# Patient Record
Sex: Male | Born: 1975 | Race: White | Hispanic: No | Marital: Married | Smoking: Never smoker
Health system: Southern US, Community
[De-identification: ages and names within clinical notes are randomized; demographics above are authoritative.]

## PROBLEM LIST (undated history)

## (undated) DIAGNOSIS — E785 Hyperlipidemia, unspecified: Secondary | ICD-10-CM

## (undated) DIAGNOSIS — T7840XA Allergy, unspecified, initial encounter: Secondary | ICD-10-CM

## (undated) HISTORY — DX: Allergy, unspecified, initial encounter: T78.40XA

## (undated) HISTORY — DX: Hyperlipidemia, unspecified: E78.5

## (undated) HISTORY — PX: ADENOIDECTOMY: SUR15

---

## 2019-01-21 ENCOUNTER — Other Ambulatory Visit: Payer: Self-pay

## 2019-01-21 ENCOUNTER — Encounter: Payer: Self-pay | Admitting: Sports Medicine

## 2019-01-21 ENCOUNTER — Ambulatory Visit (INDEPENDENT_AMBULATORY_CARE_PROVIDER_SITE_OTHER): Payer: 59 | Admitting: Sports Medicine

## 2019-01-21 DIAGNOSIS — R21 Rash and other nonspecific skin eruption: Secondary | ICD-10-CM

## 2019-01-21 MED ORDER — CLOTRIMAZOLE-BETAMETHASONE 1-0.05 % EX CREA: 1.0000 "application " | TOPICAL_CREAM | Freq: Two times a day (BID) | CUTANEOUS | 0 refills | Status: DC

## 2019-01-21 NOTE — Patient Instructions (Signed)
Appears to be simple lichenification, though unsure as to whether this is lichen simplex, lichen planus, idiopathic lichenification, or lichen sclerosis, as these would be more dermatopathologic diagnoses. Adding topical Lotrisone. We will do this for a solid month twice a day, if no improvement at the 1 month follow-up we will most certainly do a biopsy.  

## 2019-01-21 NOTE — Assessment & Plan Note (Addendum)
Appears to be simple lichenification, though unsure as to whether this is lichen simplex, lichen planus, idiopathic lichenification, or lichen sclerosis, as these would be more dermatopathologic diagnoses. Adding topical Lotrisone. We will do this for a solid month twice a day, if no improvement at the 1 month follow-up we will most certainly do a biopsy.

## 2019-01-21 NOTE — Progress Notes (Signed)
Subjective:    CC: New patient visit with the below complaints as noted in HPI:  HPI:  Barry Burns is a pleasant 43 year old male, recently moved to [place], he has noted for the past several years a rash on his left scrotum, just at the base of the shaft of the penis.  It is asymptomatic but he feels as though it has been growing recently.  Symptoms are moderate, persistent, localized without radiation.  He recently had routine labs done last fall.  I reviewed the past medical history, family history, social history, surgical history, and allergies today and no changes were needed.  Please see the problem list section below in epic for further details.  Past Medical History: Past Medical History:  Diagnosis Date  . Hyperlipidemia    Past Surgical History: Past Surgical History:  Procedure Laterality Date  . ADENOIDECTOMY     Social History: Social History   Socioeconomic History  . Marital status: Married    Spouse name: Not on file  . Number of children: Not on file  . Years of education: Not on file  . Highest education level: Not on file  Occupational History  . Not on file  Social Needs  . Financial resource strain: Not on file  . Food insecurity    Worry: Not on file    Inability: Not on file  . Transportation needs    Medical: Not on file    Non-medical: Not on file  Tobacco Use  . Smoking status: Never Smoker  . Smokeless tobacco: Never Used  Substance and Sexual Activity  . Alcohol use: Yes  . Drug use: Never  . Sexual activity: Yes  Lifestyle  . Physical activity    Days per week: Not on file    Minutes per session: Not on file  . Stress: Not on file  Relationships  . Social Herbalist on phone: Not on file    Gets together: Not on file    Attends religious service: Not on file    Active member of club or organization: Not on file    Attends meetings of clubs or organizations: Not on file    Relationship status: Not on file  Other  Topics Concern  . Not on file  Social History Narrative  . Not on file   Family History: Family History  Problem Relation Age of Onset  . Hypertension Mother   . Diabetes Father    Allergies: Allergies  Allergen Reactions  . Penicillins    Medications: See med rec.  Review of Systems: No headache, visual changes, nausea, vomiting, diarrhea, constipation, dizziness, abdominal pain, skin rash, fevers, chills, night sweats, swollen lymph nodes, weight loss, chest pain, body aches, joint swelling, muscle aches, shortness of breath, mood changes, visual or auditory hallucinations.  Objective:    General: Well Developed, well nourished, and in no acute distress.  Neuro: Alert and oriented x3, extra-ocular muscles intact, sensation grossly intact.  HEENT: Normocephalic, atraumatic, pupils equal round reactive to light, neck supple, no masses, no lymphadenopathy, thyroid nonpalpable.  Skin: Warm and dry, on the left scrotum at the base of the penis there is a 2 cm lichenified rash somewhat lighter than the surrounding skin with a slightly raised border.  No signs of bacterial infection, no actual scaliness.  No hyperpigmentation. Cardiac: Regular rate and rhythm, no murmurs rubs or gallops.  Respiratory: Clear to auscultation bilaterally. Not using accessory muscles, speaking in full sentences.  Abdominal: Soft, nontender,  nondistended, positive bowel sounds, no masses, no organomegaly.  Musculoskeletal: Shoulder, elbow, wrist, hip, knee, ankle stable, and with full range of motion.  Impression and Recommendations:    The patient was counselled, risk factors were discussed, anticipatory guidance given.  Rash on scrotum Appears to be simple lichenification, though unsure as to whether this is lichen simplex, lichen planus, idiopathic lichenification, or lichen sclerosis, as these would be more dermatopathologic diagnoses. Adding topical Lotrisone. We will do this for a solid month twice  a day, if no improvement at the 1 month follow-up we will most certainly do a biopsy.    ___________________________________________ Ihor Austinhomas J. Benjamin Stainhekkekandam, M.D., ABFM., CAQSM. Primary Care and Sports Medicine [place] [facility]  Adjunct Professor of Family Medicine  [employer]

## 2019-02-18 ENCOUNTER — Ambulatory Visit (INDEPENDENT_AMBULATORY_CARE_PROVIDER_SITE_OTHER): Payer: 59 | Admitting: Sports Medicine

## 2019-02-18 ENCOUNTER — Other Ambulatory Visit: Payer: Self-pay

## 2019-02-18 ENCOUNTER — Encounter: Payer: Self-pay | Admitting: Sports Medicine

## 2019-02-18 DIAGNOSIS — R21 Rash and other nonspecific skin eruption: Secondary | ICD-10-CM

## 2019-02-18 NOTE — Progress Notes (Signed)
  Subjective:    CC: Follow-up  HPI: Scrotal rash: Resolved.  I reviewed the past medical history, family history, social history, surgical history, and allergies today and no changes were needed.  Please see the problem list section below in epic for further details.  Past Medical History: Past Medical History:  Diagnosis Date  . Hyperlipidemia    Past Surgical History: Past Surgical History:  Procedure Laterality Date  . ADENOIDECTOMY     Social History: Social History   Socioeconomic History  . Marital status: Married    Spouse name: Not on file  . Number of children: Not on file  . Years of education: Not on file  . Highest education level: Not on file  Occupational History  . Not on file  Social Needs  . Financial resource strain: Not on file  . Food insecurity    Worry: Not on file    Inability: Not on file  . Transportation needs    Medical: Not on file    Non-medical: Not on file  Tobacco Use  . Smoking status: Never Smoker  . Smokeless tobacco: Never Used  Substance and Sexual Activity  . Alcohol use: Yes  . Drug use: Never  . Sexual activity: Yes  Lifestyle  . Physical activity    Days per week: Not on file    Minutes per session: Not on file  . Stress: Not on file  Relationships  . Social Herbalist on phone: Not on file    Gets together: Not on file    Attends religious service: Not on file    Active member of club or organization: Not on file    Attends meetings of clubs or organizations: Not on file    Relationship status: Not on file  Other Topics Concern  . Not on file  Social History Narrative  . Not on file   Family History: Family History  Problem Relation Age of Onset  . Hypertension Mother   . Diabetes Father    Allergies: Allergies  Allergen Reactions  . Penicillins    Medications: See med rec.  Review of Systems: No fevers, chills, night sweats, weight loss, chest pain, or shortness of breath.   Objective:     General: Well Developed, well nourished, and in no acute distress.  Neuro: Alert and oriented x3, extra-ocular muscles intact, sensation grossly intact.  HEENT: Normocephalic, atraumatic, pupils equal round reactive to light, neck supple, no masses, no lymphadenopathy, thyroid nonpalpable.  Skin: Warm and dry, no rashes. Cardiac: Regular rate and rhythm, no murmurs rubs or gallops, no lower extremity edema.  Respiratory: Clear to auscultation bilaterally. Not using accessory muscles, speaking in full sentences. Scrotum: Looks normal.  Impression and Recommendations:    Rash on scrotum Initial lichenified scrotal rash has completely resolved. May stop the Lotrisone and return as needed for this.   ___________________________________________ Gwen Her. Dianah Field, M.D., ABFM., CAQSM. Primary Care and Sports Medicine [facility] [facility]  Adjunct Professor of [place] [employer]

## 2019-02-18 NOTE — Assessment & Plan Note (Signed)
Initial lichenified scrotal rash has completely resolved. May stop the Lotrisone and return as needed for this.

## 2021-06-08 ENCOUNTER — Ambulatory Visit (INDEPENDENT_AMBULATORY_CARE_PROVIDER_SITE_OTHER): Payer: 59 | Admitting: Sports Medicine

## 2021-06-08 ENCOUNTER — Other Ambulatory Visit: Payer: Self-pay

## 2021-06-08 DIAGNOSIS — Z0000 Encounter for general adult medical examination without abnormal findings: Secondary | ICD-10-CM | POA: Diagnosis not present

## 2021-06-08 DIAGNOSIS — L608 Other nail disorders: Secondary | ICD-10-CM | POA: Diagnosis not present

## 2021-06-08 DIAGNOSIS — M25473 Effusion, unspecified ankle: Secondary | ICD-10-CM | POA: Insufficient documentation

## 2021-06-08 DIAGNOSIS — E785 Hyperlipidemia, unspecified: Secondary | ICD-10-CM | POA: Diagnosis not present

## 2021-06-08 NOTE — Assessment & Plan Note (Signed)
This pleasant 45 year old male has had a few episodes in the past of severe ankle swelling and pain, hitting a crescendo over a couple of days and then resolving. Prednisone seem to help, he was seen in orthopedic urgent care, x-rays unrevealing. Typically this has occurred after exercising, based on history this sounds to be either a stress injury or pseudogout, if this happens again we will do an arthrocentesis for crystal analysis.   He understands to come in ASAP if and when this happens again. Checking uric acid levels in the meantime.

## 2021-06-08 NOTE — Progress Notes (Addendum)
    Procedures performed today:    None.  Independent interpretation of notes and tests performed by another provider:   None.  Brief History, Exam, Impression, and Recommendations:    Ankle swelling and pain with hyperuricemia This pleasant 45 year old male has had a few episodes in the past of severe ankle swelling and pain, hitting a crescendo over a couple of days and then resolving. Prednisone seem to help, he was seen in orthopedic urgent care, x-rays unrevealing. Typically this has occurred after exercising, based on history this sounds to be either a stress injury or pseudogout, if this happens again we will do an arthrocentesis for crystal analysis.   He understands to come in ASAP if and when this happens again. Checking uric acid levels in the meantime.   Longitudinal melanonychia He has also noted a brown streak longitudinally over his thumbnail, it is light brown, there is no subungual discoloration, there is no depression in the nail itself, i.e. a negative Hutchinson sign. We will keep an eye on this.   Hyperlipidemia Lipids highly elevated.  Probably needs a statin.    ___________________________________________ Ihor Austin. Benjamin Stain, M.D., ABFM., CAQSM. Primary Care and Sports Medicine [place] [facility]  Adjunct Instructor of Family Medicine  [employer]

## 2021-06-08 NOTE — Assessment & Plan Note (Signed)
He has also noted a brown streak longitudinally over his thumbnail, it is light brown, there is no subungual discoloration, there is no depression in the nail itself, i.e. a negative Hutchinson sign. We will keep an eye on this.

## 2021-06-13 LAB — COMPREHENSIVE METABOLIC PANEL
AG Ratio: 1.9 (calc) (ref 1.0–2.5)
ALT: 55 U/L — ABNORMAL HIGH (ref 9–46)
AST: 35 U/L (ref 10–40)
Albumin: 4.6 g/dL (ref 3.6–5.1)
Alkaline phosphatase (APISO): 52 U/L (ref 36–130)
BUN: 15 mg/dL (ref 7–25)
CO2: 28 mmol/L (ref 20–32)
Calcium: 9.8 mg/dL (ref 8.6–10.3)
Chloride: 102 mmol/L (ref 98–110)
Creat: 1.07 mg/dL (ref 0.60–1.29)
Globulin: 2.4 g/dL (calc) (ref 1.9–3.7)
Glucose, Bld: 96 mg/dL (ref 65–99)
Potassium: 4.6 mmol/L (ref 3.5–5.3)
Sodium: 138 mmol/L (ref 135–146)
Total Bilirubin: 0.7 mg/dL (ref 0.2–1.2)
Total Protein: 7 g/dL (ref 6.1–8.1)

## 2021-06-13 LAB — URIC ACID: Uric Acid, Serum: 9.1 mg/dL — ABNORMAL HIGH (ref 4.0–8.0)

## 2021-06-13 LAB — CBC
HCT: 46.3 % (ref 38.5–50.0)
Hemoglobin: 15.8 g/dL (ref 13.2–17.1)
MCH: 30.7 pg (ref 27.0–33.0)
MCHC: 34.1 g/dL (ref 32.0–36.0)
MCV: 89.9 fL (ref 80.0–100.0)
MPV: 9.5 fL (ref 7.5–12.5)
Platelets: 194 10*3/uL (ref 140–400)
RBC: 5.15 10*6/uL (ref 4.20–5.80)
RDW: 12.8 % (ref 11.0–15.0)
WBC: 4.6 10*3/uL (ref 3.8–10.8)

## 2021-06-13 LAB — TSH: TSH: 3.31 mIU/L (ref 0.40–4.50)

## 2021-06-13 LAB — LIPID PANEL
Cholesterol: 312 mg/dL — ABNORMAL HIGH (ref <200)
HDL: 53 mg/dL (ref >=40)
LDL Cholesterol (Calc): 199 mg/dL (calc) — ABNORMAL HIGH
Non-HDL Cholesterol (Calc): 259 mg/dL (calc) — ABNORMAL HIGH (ref <130)
Total CHOL/HDL Ratio: 5.9 (calc) — ABNORMAL HIGH (ref <5.0)
Triglycerides: 352 mg/dL — ABNORMAL HIGH (ref <150)

## 2021-06-13 NOTE — Assessment & Plan Note (Signed)
Lipids highly elevated.  Probably needs a statin.

## 2021-06-18 ENCOUNTER — Encounter: Payer: Self-pay | Admitting: Sports Medicine

## 2021-06-18 ENCOUNTER — Other Ambulatory Visit: Payer: Self-pay

## 2021-06-18 ENCOUNTER — Ambulatory Visit (INDEPENDENT_AMBULATORY_CARE_PROVIDER_SITE_OTHER): Payer: 59 | Admitting: Sports Medicine

## 2021-06-18 VITALS — BP 147/96 | HR 75 | Wt 220.0 lb

## 2021-06-18 DIAGNOSIS — M25473 Effusion, unspecified ankle: Secondary | ICD-10-CM | POA: Diagnosis not present

## 2021-06-18 DIAGNOSIS — Z23 Encounter for immunization: Secondary | ICD-10-CM | POA: Diagnosis not present

## 2021-06-18 DIAGNOSIS — E785 Hyperlipidemia, unspecified: Secondary | ICD-10-CM | POA: Diagnosis not present

## 2021-06-18 DIAGNOSIS — Z0000 Encounter for general adult medical examination without abnormal findings: Secondary | ICD-10-CM | POA: Diagnosis not present

## 2021-06-18 MED ORDER — ALLOPURINOL 300 MG PO TABS: 300.0000 mg | ORAL_TABLET | Freq: Every day | ORAL | 3 refills | Status: DC

## 2021-06-18 MED ORDER — ROSUVASTATIN CALCIUM 20 MG PO TABS: 20.0000 mg | ORAL_TABLET | Freq: Every day | ORAL | 3 refills | Status: DC

## 2021-06-18 NOTE — Assessment & Plan Note (Signed)
Annual physical as above, Tdap today. Return to see me in a year.

## 2021-06-18 NOTE — Assessment & Plan Note (Signed)
Moderate hyperlipidemia, adding rosuvastatin 20, recheck in 2 months.

## 2021-06-18 NOTE — Assessment & Plan Note (Signed)
See prior notes, adding allopurinol, recheck in 2 months.

## 2021-06-18 NOTE — Addendum Note (Signed)
Addended by: Gaynelle Arabian on: 06/18/2021 10:36 AM   Modules accepted: Orders

## 2021-06-18 NOTE — Progress Notes (Signed)
  Subjective:    CC: Annual Physical Exam  HPI:  This patient is here for their annual physical  I reviewed the past medical history, family history, social history, surgical history, and allergies today and no changes were needed.  Please see the problem list section below in epic for further details.  Past Medical History: Past Medical History:  Diagnosis Date   Hyperlipidemia    Past Surgical History: Past Surgical History:  Procedure Laterality Date   ADENOIDECTOMY     Social History: Social History   Socioeconomic History   Marital status: Married    Spouse name: Not on file   Number of children: Not on file   Years of education: Not on file   Highest education level: Not on file  Occupational History   Not on file  Tobacco Use   Smoking status: Never   Smokeless tobacco: Never  Substance and Sexual Activity   Alcohol use: Yes   Drug use: Never   Sexual activity: Yes  Other Topics Concern   Not on file  Social History Narrative   Not on file   Social Determinants of Health   Financial Resource Strain: Not on file  Food Insecurity: Not on file  Transportation Needs: Not on file  Physical Activity: Not on file  Stress: Not on file  Social Connections: Not on file   Family History: Family History  Problem Relation Age of Onset   Hypertension Mother    Diabetes Father    Allergies: Allergies  Allergen Reactions   Penicillins    Medications: See med rec.  Review of Systems: No headache, visual changes, nausea, vomiting, diarrhea, constipation, dizziness, abdominal pain, skin rash, fevers, chills, night sweats, swollen lymph nodes, weight loss, chest pain, body aches, joint swelling, muscle aches, shortness of breath, mood changes, visual or auditory hallucinations.  Objective:    General: Well Developed, well nourished, and in no acute distress.  Neuro: Alert and oriented x3, extra-ocular muscles intact, sensation grossly intact. Cranial nerves II  through XII are intact, motor, sensory, and coordinative functions are all intact. HEENT: Normocephalic, atraumatic, pupils equal round reactive to light, neck supple, no masses, no lymphadenopathy, thyroid nonpalpable. Oropharynx, nasopharynx, external ear canals are unremarkable. Skin: Warm and dry, no rashes noted.  Cardiac: Regular rate and rhythm, no murmurs rubs or gallops.  Respiratory: Clear to auscultation bilaterally. Not using accessory muscles, speaking in full sentences.  Abdominal: Soft, nontender, nondistended, positive bowel sounds, no masses, no organomegaly.  Musculoskeletal: Shoulder, elbow, wrist, hip, knee, ankle stable, and with full range of motion.  Impression and Recommendations:    The patient was counselled, risk factors were discussed, anticipatory guidance given.  Annual physical exam Annual physical as above, Tdap today. Return to see me in a year.  Ankle swelling and pain with hyperuricemia See prior notes, adding allopurinol, recheck in 2 months.  Hyperlipidemia Moderate hyperlipidemia, adding rosuvastatin 20, recheck in 2 months.   ___________________________________________ Ihor Austin. Benjamin Stain, M.D., ABFM., CAQSM. Primary Care and Sports Medicine [place] [facility]  Adjunct Professor of Family Medicine  [employer]

## 2021-07-31 ENCOUNTER — Encounter: Payer: Self-pay | Admitting: Sports Medicine

## 2021-07-31 DIAGNOSIS — M25473 Effusion, unspecified ankle: Secondary | ICD-10-CM

## 2021-08-01 ENCOUNTER — Ambulatory Visit (INDEPENDENT_AMBULATORY_CARE_PROVIDER_SITE_OTHER): Payer: 59

## 2021-08-01 ENCOUNTER — Other Ambulatory Visit: Payer: Self-pay

## 2021-08-01 ENCOUNTER — Ambulatory Visit (INDEPENDENT_AMBULATORY_CARE_PROVIDER_SITE_OTHER): Payer: 59 | Admitting: Sports Medicine

## 2021-08-01 DIAGNOSIS — M25473 Effusion, unspecified ankle: Secondary | ICD-10-CM

## 2021-08-01 MED ORDER — PREDNISONE 50 MG PO TABS: 50.0000 mg | ORAL_TABLET | Freq: Every day | ORAL | 0 refills | Status: AC

## 2021-08-01 NOTE — Assessment & Plan Note (Signed)
This pleasant 46 year old male returns, he is having what appears to be a gout flare. Increasing pain, swelling, left first MTP and left first IP joint. The entire foot is however swollen. We did ultrasound the foot looking for drainable fluid collection, he has significant synovitis at the IP joint and the MTP but nothing that would be amenable to drainage for arthrocentesis and crystal analysis. Adding a 5-day burst of prednisone, I would like him to return to get his uric acid levels rechecked a month after the flare. Goal uric acid levels are less than 5.

## 2021-08-01 NOTE — Progress Notes (Signed)
° ° °  Procedures performed today:    Procedure: Diagnostic Ultrasound of left foot Device: Samsung [id number]  Findings: Noted synovitis left first MTP and left first IP joint Images permanently stored in PACS. 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  Independent interpretation of notes and tests performed by another provider:   None.  Brief History, Exam, Impression, and Recommendations:    Ankle swelling and pain with hyperuricemia This pleasant 46 year old male returns, he is having what appears to be a gout flare. Increasing pain, swelling, left first MTP and left first IP joint. The entire foot is however swollen. We did ultrasound the foot looking for drainable fluid collection, he has significant synovitis at the IP joint and the MTP but nothing that would be amenable to drainage for arthrocentesis and crystal analysis. Adding a 5-day burst of prednisone, I would like him to return to get his uric acid levels rechecked a month after the flare. Goal uric acid levels are less than 5.  Chronic process with exacerbation and pharmacologic intervention  ___________________________________________ Barry Burns. Benjamin Stain, M.D., ABFM., CAQSM. Primary Care and Sports Medicine [place] [facility]  Adjunct Instructor of Family Medicine  [employer]

## 2021-08-07 NOTE — Telephone Encounter (Signed)
MRI of the foot and ankle ordered, he has had x-rays at an outside facility within the past 6 weeks, has failed greater than 6 weeks of conservative treatment in spite of injections, medications.

## 2021-08-12 ENCOUNTER — Ambulatory Visit (INDEPENDENT_AMBULATORY_CARE_PROVIDER_SITE_OTHER): Payer: 59

## 2021-08-12 ENCOUNTER — Other Ambulatory Visit: Payer: Self-pay

## 2021-08-12 DIAGNOSIS — M25572 Pain in left ankle and joints of left foot: Secondary | ICD-10-CM

## 2021-08-12 DIAGNOSIS — M25472 Effusion, left ankle: Secondary | ICD-10-CM | POA: Diagnosis not present

## 2021-08-12 DIAGNOSIS — M25473 Effusion, unspecified ankle: Secondary | ICD-10-CM

## 2021-08-12 DIAGNOSIS — M79672 Pain in left foot: Secondary | ICD-10-CM | POA: Diagnosis not present

## 2021-08-12 IMAGING — MR MR ANKLE*L* W/O CM
4 of 5 series · 30 of 40 positions shown · non-contrast
Comparison: None.

CLINICAL DATA: Ankle pain. Osteochondral lesion suspected.
Persistent ankle pain and swelling for 2 weeks.

EXAM:
MRI OF THE LEFT ANKLE WITHOUT CONTRAST
TECHNIQUE: Multiplanar, multisequence MR imaging of the ankle was performed. No
intravenous contrast was administered.

[Series 1: PD fat-sat · axial · 3.0mm · 0.62mm/px · z∈[-99,+48]mm · 8 of 37 slices shown]
[im 1/37]
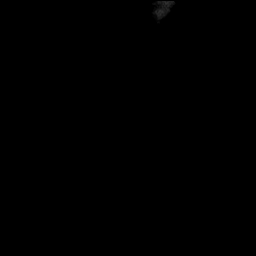
[im 6/37]
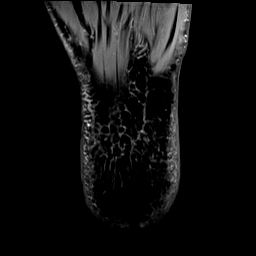
[im 11/37]
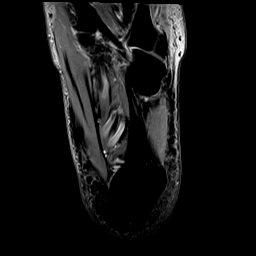
[im 16/37]
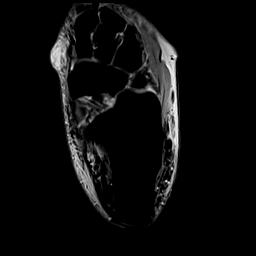
[im 21/37]
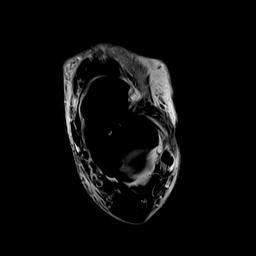
[im 26/37]
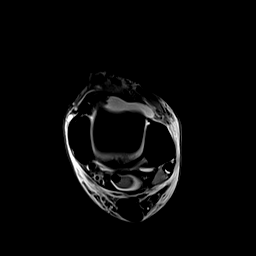
[im 31/37]
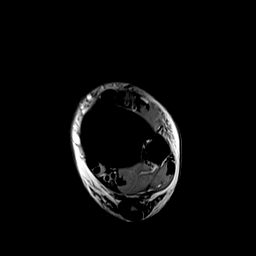
[im 37/37]
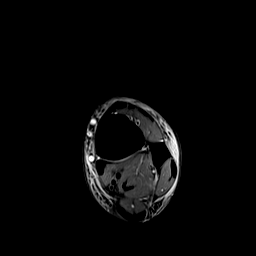

[Series 2: T2 fat-sat · axial · 3.0mm · 0.62mm/px · z∈[-99,+48]mm · 9 of 37 slices shown (1 of 2)]
[im 1/37]
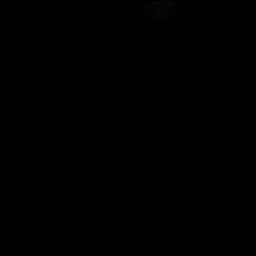
[im 5/37]
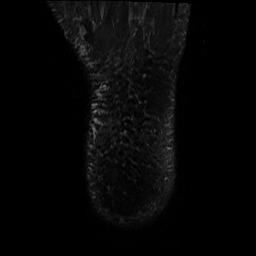
[im 10/37]
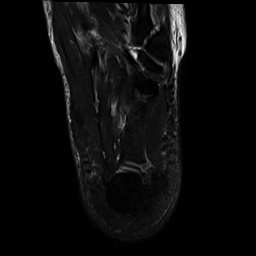
[im 14/37]
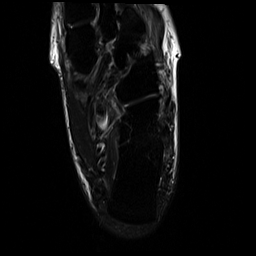
[im 19/37]
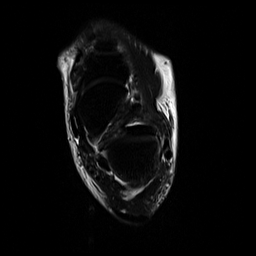
[im 23/37]
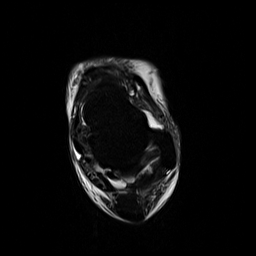
[im 28/37]
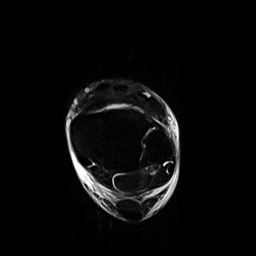
[im 32/37]
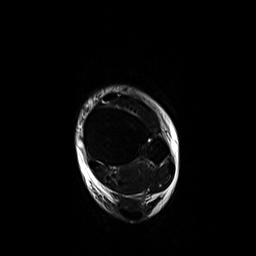
[im 37/37]
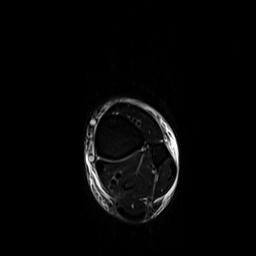

[Series 3: T2 fat-sat · coronal · 3.0mm · 0.31mm/px · 9 of 38 slices shown (2 of 2)]
[im 1/38]
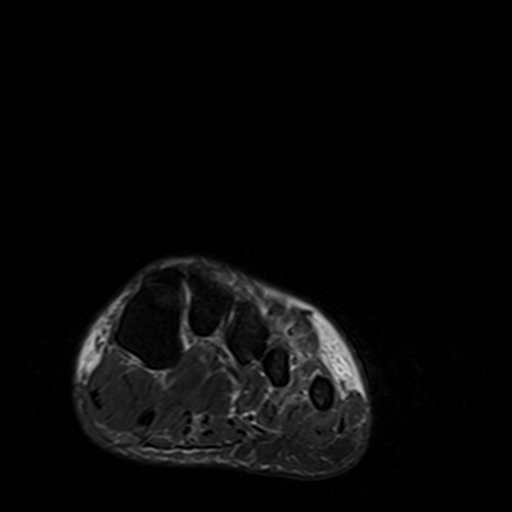
[im 5/38]
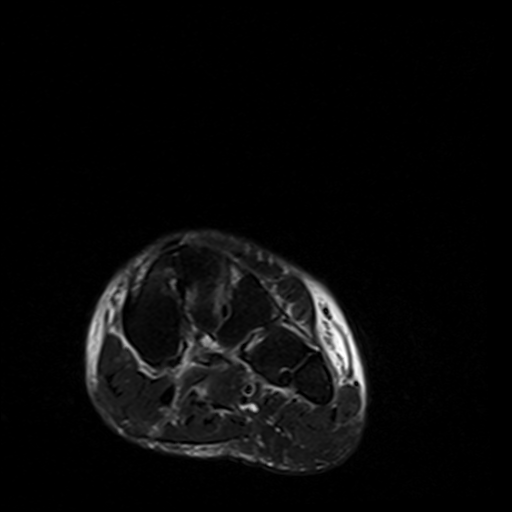
[im 10/38]
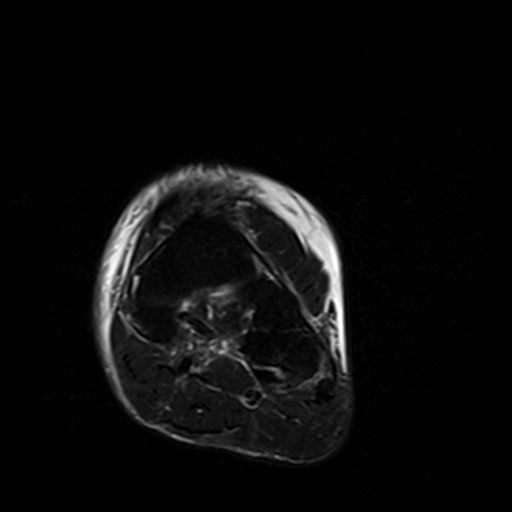
[im 14/38]
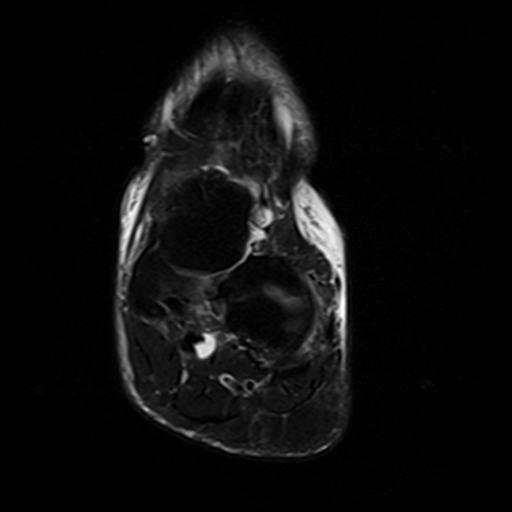
[im 19/38]
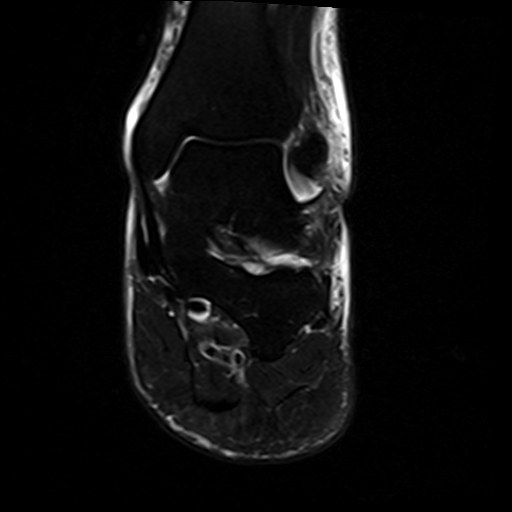
[im 24/38]
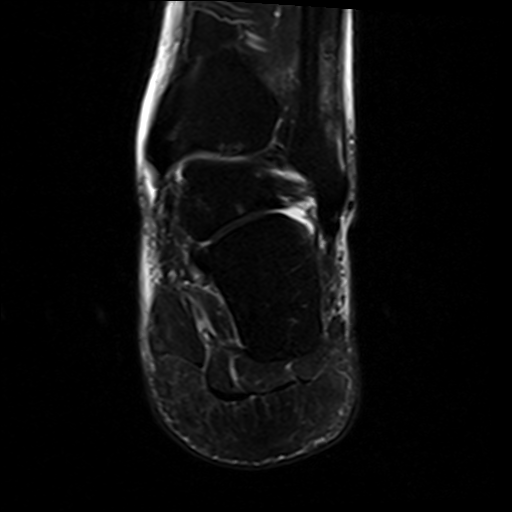
[im 28/38]
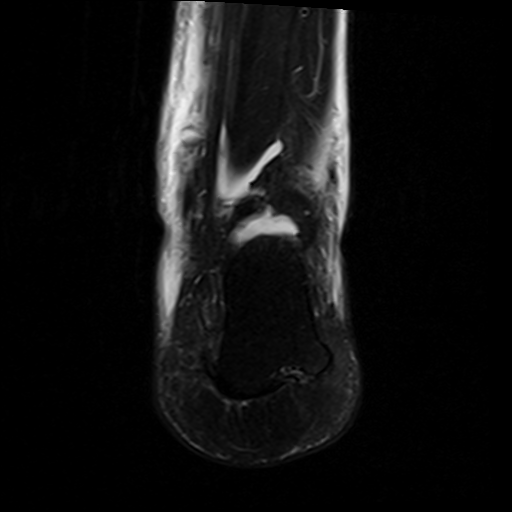
[im 33/38]
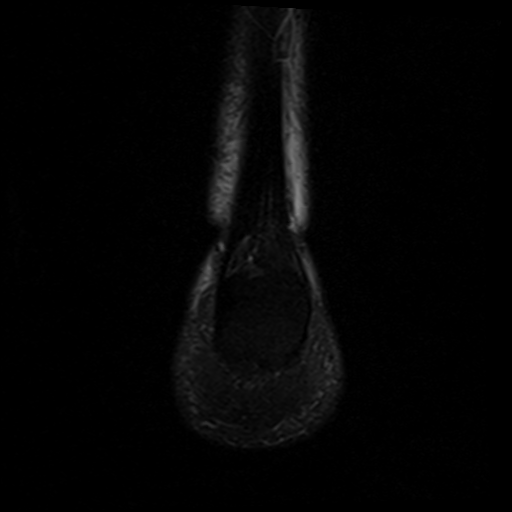
[im 38/38]
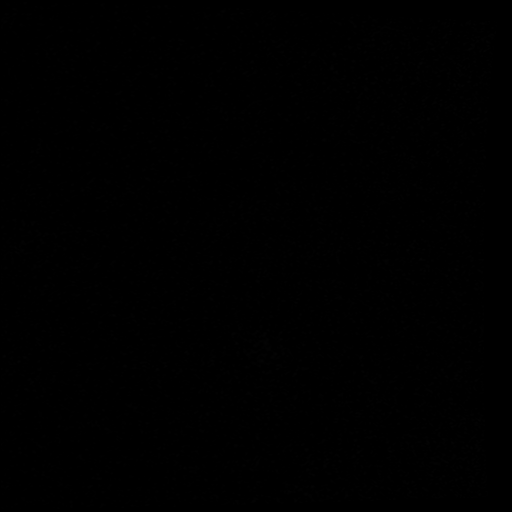

[Series 4: T1 · sagittal · 3.0mm · 0.31mm/px · 4 of 28 slices shown]
[im 1/28]
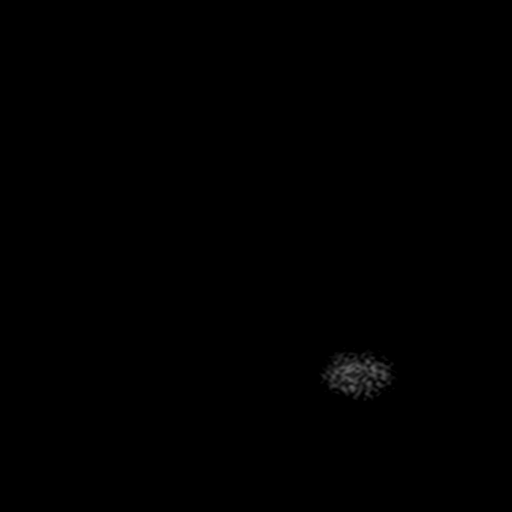
[im 5/28]
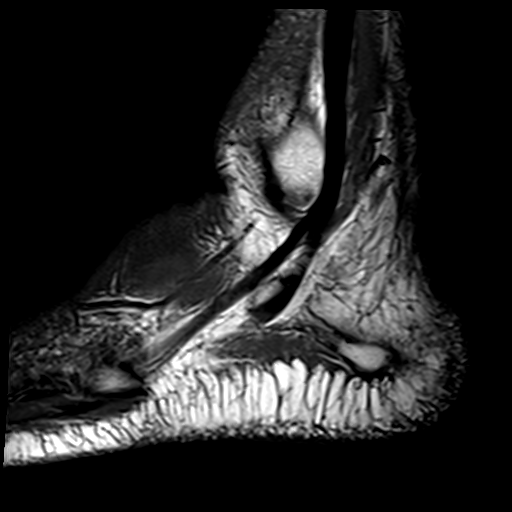
[im 14/28]
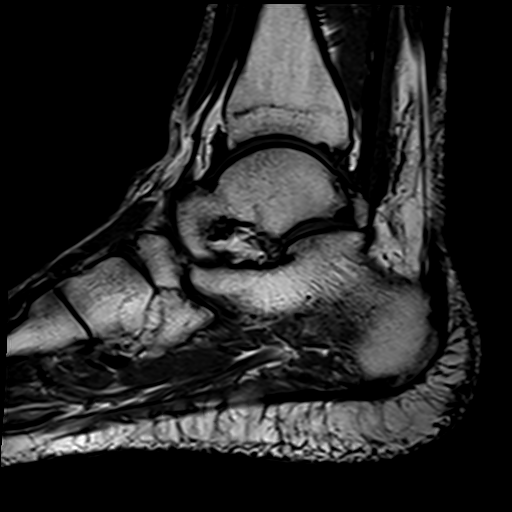
[im 23/28]
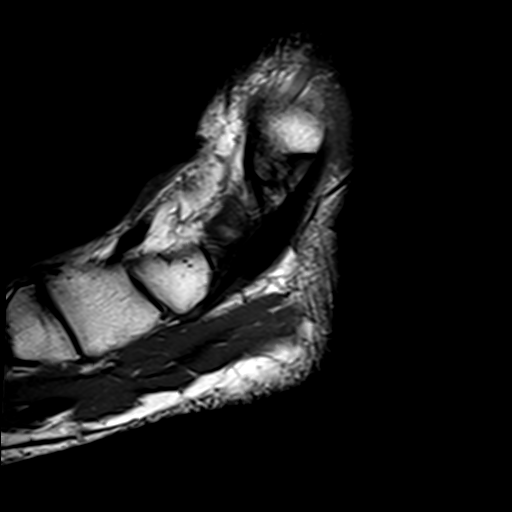

[30 of 40 positions shown; findings below may reference images not displayed]

FINDINGS: TENDONS

Peroneal: There is a mild "JAYLON configuration" of the peroneus
brevis tendon just distal to the fibula (axial images 19 through 21,
coronal images 17 and 18). The peroneus longus tendon is intact.

Posteromedial: Mild posterior tibial tenosynovitis. There are
mild-to-moderate tibiotalar and subtalar joint effusions. Some of
this joint fluid is likely the etiology for moderate fluid within
the flexor hallucis longus tendon sheath within the posterior ankle
and the plantar hindfoot proximal to the intersection with the
flexor digitorum longus tendon at the knot of JAYLON. Also is likely
mild superimposed flexor hallucis longus tenosynovitis. The flexor
digitorum longus tendon is intact.

Anterior: The tibialis anterior and extensor hallucis longus tendons
are intact. Mild extensor digitorum longus tenosynovitis at the
level of the distal tibia.

Achilles: Intact.

Plantar Fascia: Intact.

LIGAMENTS

Lateral: The anterior and posterior talofibular, anterior and
posterior tibiofibular, and calcaneofibular ligaments are intact.

Medial: The tibiotalar deep deltoid and tibial spring ligaments are
intact.

CARTILAGE

Ankle Joint: There is mild thinning of the posterior tibial plafond
cartilage with focal subchondral increased T2 signal in a region
measuring up to 4 mm in AP dimension (sagittal image 14) and 7 mm in
transverse dimension (coronal image 15).

Subtalar Joints/Sinus Tarsi: Fat is preserved within sinus tarsi.

Bones: Mild cartilage thinning and subchondral marrow edema within
the second tarsometatarsal joint.

Other: The tarsal tunnel is unremarkable. The Lisfranc ligament
complex is intact.
IMPRESSION: :
IMPRESSION: 1. Mild partial-thickness longitudinal split tear of the peroneus
brevis tendon distal to the fibula.
2. Mild posterior tibial and flexor hallucis longus tenosynovitis.
Some of the fluid within the flexor hallucis longus tendon sheath is
likely secondary to tibiotalar and subtalar joint effusions.
3. Mild extensor digitorum longus tenosynovitis.
4. Mild thinning of the posterior tibial plafond cartilage.

## 2021-08-12 IMAGING — MR MR FOOT*L* W/O CM
5 series · 40 of 40 positions shown · non-contrast
Comparison: None available

CLINICAL DATA: Foot pain. Stress fracture suspected. Persistent
foot pain and swelling for 2 weeks.

EXAM:
MRI OF THE LEFT FOOT WITHOUT CONTRAST
TECHNIQUE: Multiplanar, multisequence MR imaging of the left forefoot was
performed. No intravenous contrast was administered.

[Series 4: T1 · coronal · 3.0mm · 0.38mm/px · 11 of 46 slices shown (1 of 2)]
[im 1/46]
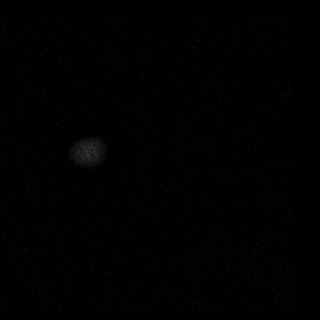
[im 5/46]
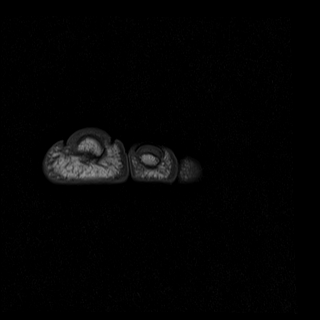
[im 10/46]
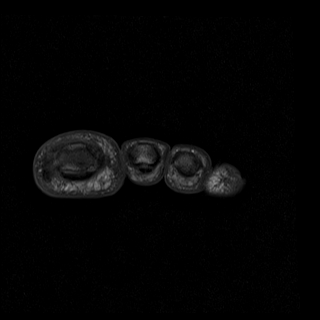
[im 14/46]
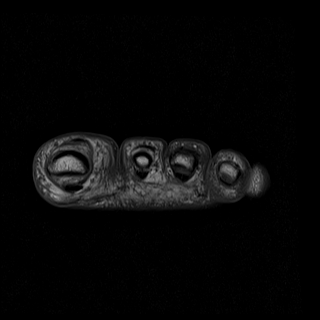
[im 19/46]
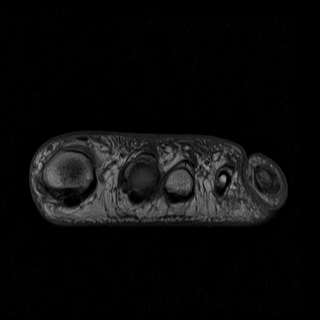
[im 23/46]
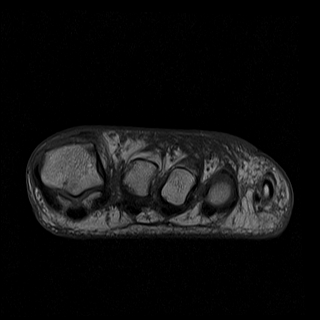
[im 28/46]
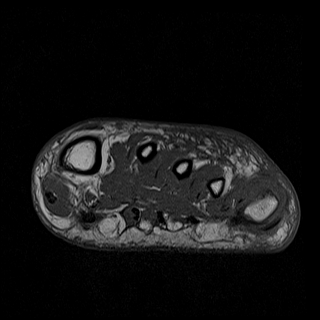
[im 32/46]
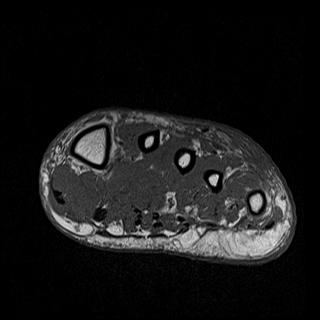
[im 37/46]
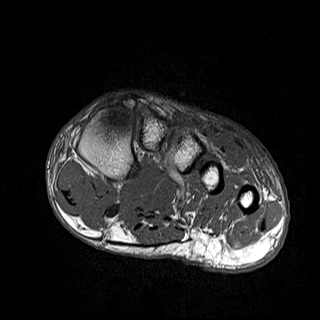
[im 41/46]
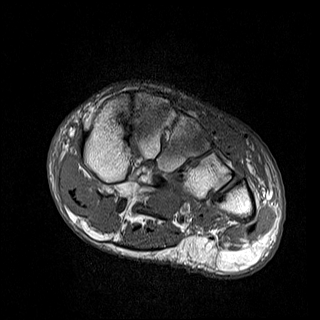
[im 46/46]
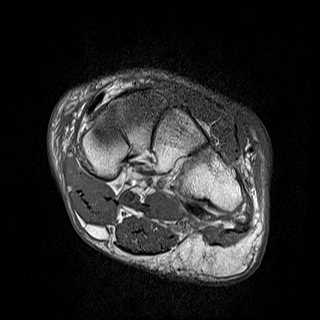

[Series 5: T2 fat-sat · coronal · 3.0mm · 0.38mm/px · 10 of 46 slices shown (1 of 2)]
[im 1/46]
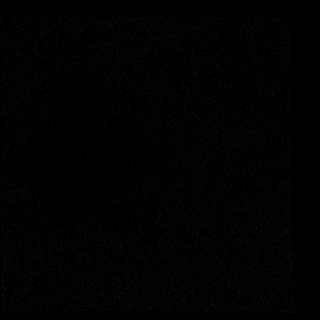
[im 6/46]
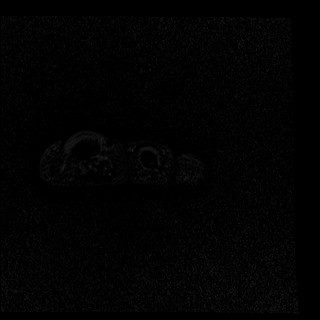
[im 11/46]
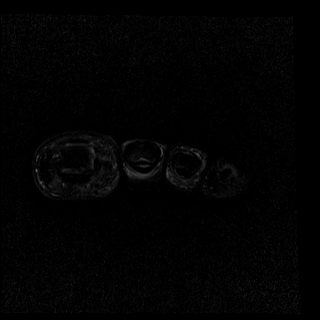
[im 16/46]
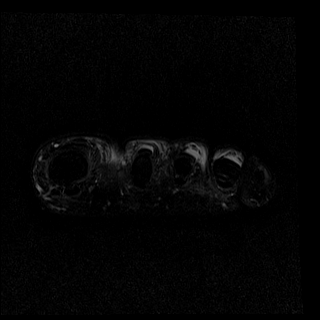
[im 21/46]
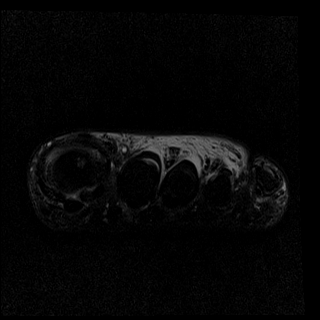
[im 26/46]
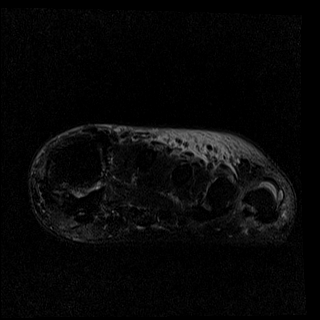
[im 31/46]
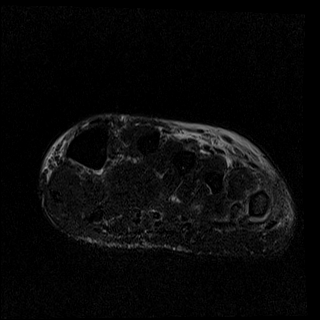
[im 36/46]
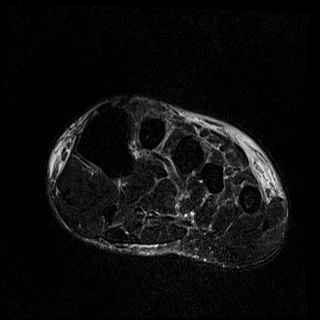
[im 41/46]
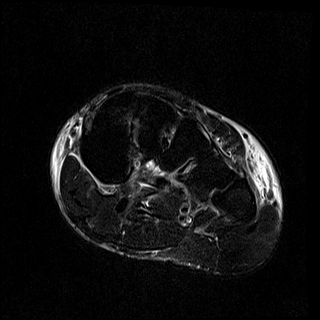
[im 46/46]
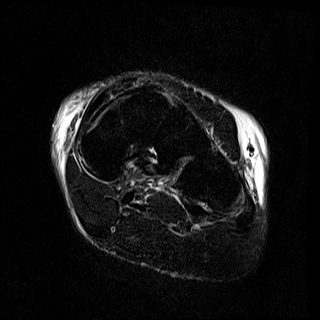

[Series 6: T1 · axial · 3.0mm · 0.70mm/px · z∈[-129,-57]mm · 6 of 26 slices shown (2 of 2)]
[im 1/26]
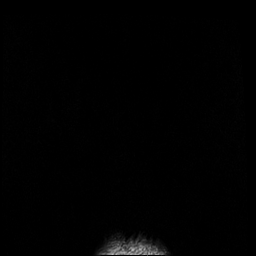
[im 6/26]
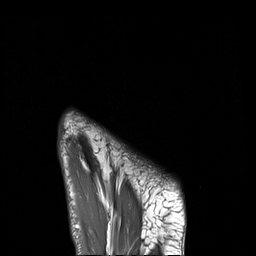
[im 11/26]
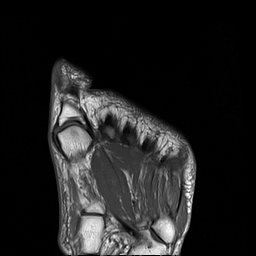
[im 16/26]
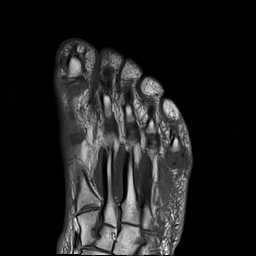
[im 21/26]
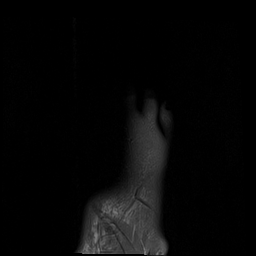
[im 26/26]
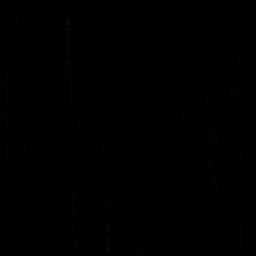

[Series 8: T2 fat-sat · axial · 3.0mm · 0.33mm/px · z∈[-128,-56]mm · 6 of 26 slices shown (2 of 2)]
[im 1/26]
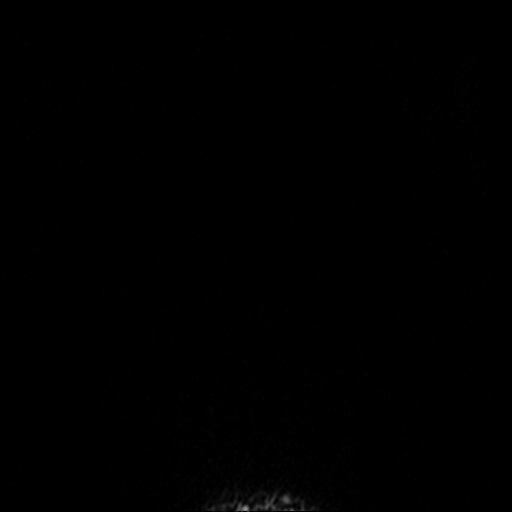
[im 6/26]
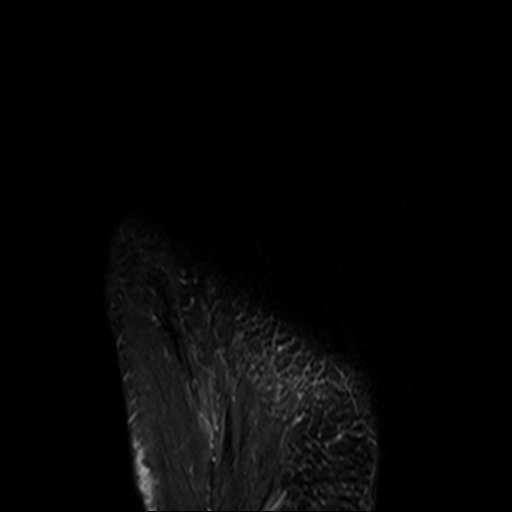
[im 11/26]
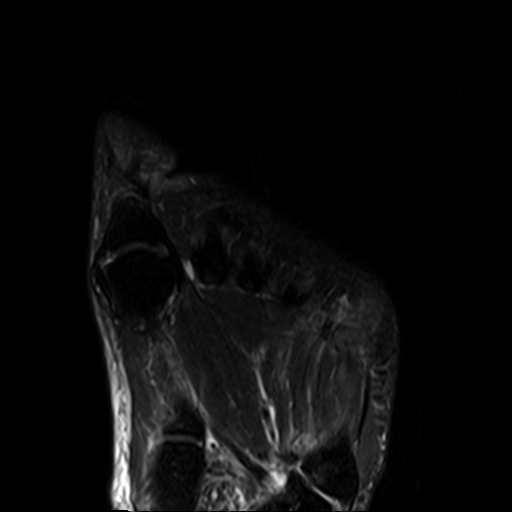
[im 16/26]
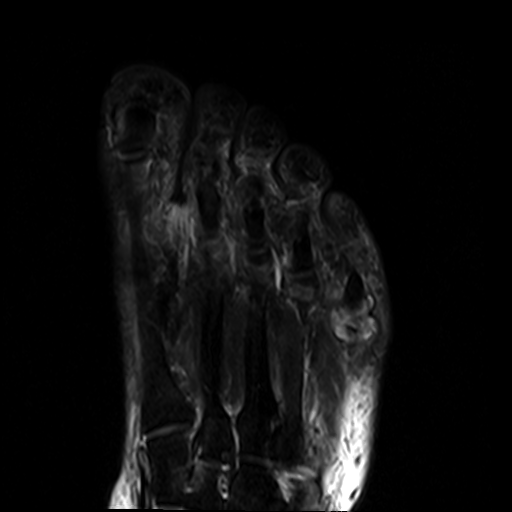
[im 21/26]
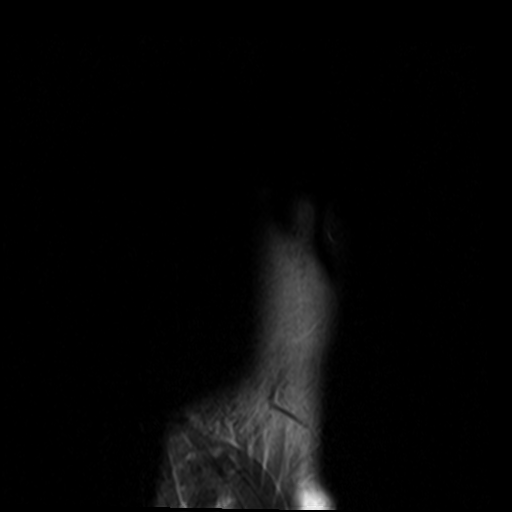
[im 26/26]
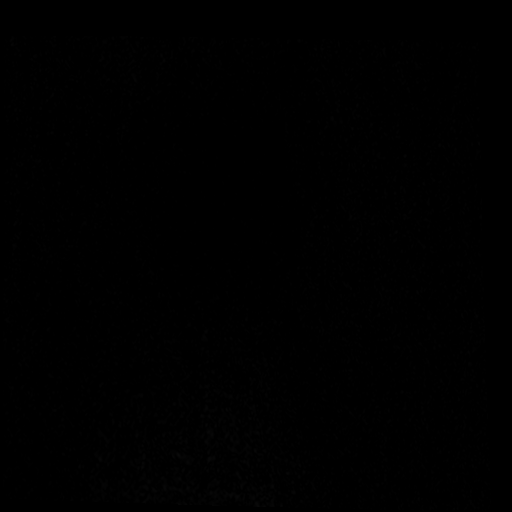

[Series 9: STIR · sagittal · 3.0mm · 0.70mm/px · 7 of 32 slices shown]
[im 1/32]
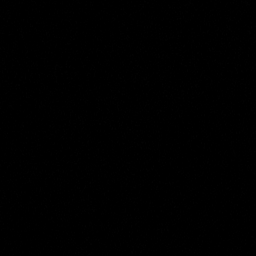
[im 6/32]
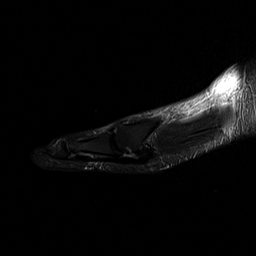
[im 11/32]
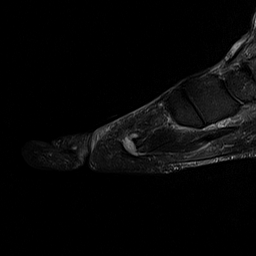
[im 16/32]
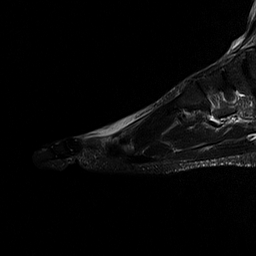
[im 21/32]
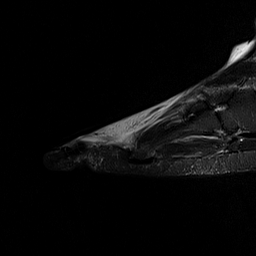
[im 26/32]
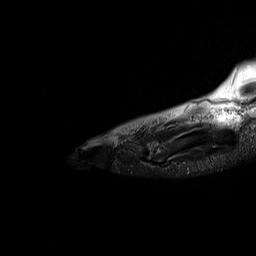
[im 32/32]
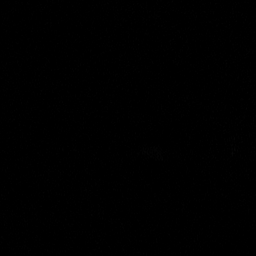

[40 of 40 positions shown; findings below may reference images not displayed]

FINDINGS: Bones/Joint/Cartilage

There are moderate degenerative changes of the great toe
metatarsophalangeal joint including cartilage thinning and
peripheral osteophytosis. Tiny early great toe metatarsal head
slightly lateral subchondral degenerative cystic change.

Ligaments

The Lisfranc ligament complex is intact. There is mild subchondral
marrow edema and cystic change within the medial aspect of second
tarsometatarsal joint near the Lisfranc ligament complex. There is
also mild likely degenerative cystic change within the medial
cuneiform at the articulation with the intermediate cuneiform.

Muscles and Tendons

Visualized flexor and extensor tendons are intact.

Soft tissues

Moderate edema and swelling of the distal forefoot dorsal soft
tissues greatest dorsal to the third and fourth metatarsophalangeal
joints.
IMPRESSION: :
IMPRESSION: 1. Moderate degenerative changes of the great toe
metatarsophalangeal joint.
2. Mild degenerative changes of the medial second tarsometatarsal
joint and the medial cuneiform-intermediate cuneiform articulation
near the Lisfranc ligament complex. The Lisfranc ligament complex
appears intact.
3. Moderate edema and swelling of the distal dorsal forefoot.

## 2021-08-14 ENCOUNTER — Encounter: Payer: Self-pay | Admitting: Sports Medicine

## 2021-09-06 LAB — COMPREHENSIVE METABOLIC PANEL
AG Ratio: 2 (calc) (ref 1.0–2.5)
ALT: 38 U/L (ref 9–46)
AST: 31 U/L (ref 10–40)
Albumin: 4.6 g/dL (ref 3.6–5.1)
Alkaline phosphatase (APISO): 53 U/L (ref 36–130)
BUN: 12 mg/dL (ref 7–25)
CO2: 28 mmol/L (ref 20–32)
Calcium: 9.6 mg/dL (ref 8.6–10.3)
Chloride: 106 mmol/L (ref 98–110)
Creat: 1.04 mg/dL (ref 0.60–1.29)
Globulin: 2.3 g/dL (calc) (ref 1.9–3.7)
Glucose, Bld: 88 mg/dL (ref 65–99)
Potassium: 4.6 mmol/L (ref 3.5–5.3)
Sodium: 142 mmol/L (ref 135–146)
Total Bilirubin: 0.5 mg/dL (ref 0.2–1.2)
Total Protein: 6.9 g/dL (ref 6.1–8.1)

## 2021-09-06 LAB — LIPID PANEL
Cholesterol: 169 mg/dL (ref <200)
HDL: 59 mg/dL (ref >=40)
LDL Cholesterol (Calc): 86 mg/dL (calc)
Non-HDL Cholesterol (Calc): 110 mg/dL (calc) (ref <130)
Total CHOL/HDL Ratio: 2.9 (calc) (ref <5.0)
Triglycerides: 147 mg/dL (ref <150)

## 2021-09-06 LAB — URIC ACID: Uric Acid, Serum: 3.5 mg/dL — ABNORMAL LOW (ref 4.0–8.0)

## 2022-04-10 ENCOUNTER — Other Ambulatory Visit: Payer: Self-pay | Admitting: Sports Medicine

## 2022-04-10 DIAGNOSIS — E785 Hyperlipidemia, unspecified: Secondary | ICD-10-CM

## 2022-05-08 ENCOUNTER — Other Ambulatory Visit: Payer: Self-pay

## 2022-05-08 DIAGNOSIS — E785 Hyperlipidemia, unspecified: Secondary | ICD-10-CM

## 2022-05-08 MED ORDER — ROSUVASTATIN CALCIUM 20 MG PO TABS: 20.0000 mg | ORAL_TABLET | Freq: Every day | ORAL | 0 refills | Status: DC

## 2022-05-08 NOTE — Telephone Encounter (Signed)
Patient called for a refill of rosuvastatin. He is due for an annual exam in December.

## 2022-05-09 NOTE — Telephone Encounter (Signed)
Scheduled 06/19/2022 @ 10:00. tvt

## 2022-06-06 ENCOUNTER — Other Ambulatory Visit: Payer: Self-pay | Admitting: Sports Medicine

## 2022-06-06 DIAGNOSIS — E785 Hyperlipidemia, unspecified: Secondary | ICD-10-CM

## 2022-06-19 ENCOUNTER — Ambulatory Visit (INDEPENDENT_AMBULATORY_CARE_PROVIDER_SITE_OTHER): Payer: 59 | Admitting: Sports Medicine

## 2022-06-19 ENCOUNTER — Encounter: Payer: Self-pay | Admitting: Sports Medicine

## 2022-06-19 VITALS — BP 139/89 | HR 76 | Wt 219.0 lb

## 2022-06-19 DIAGNOSIS — Z0000 Encounter for general adult medical examination without abnormal findings: Secondary | ICD-10-CM

## 2022-06-19 DIAGNOSIS — E785 Hyperlipidemia, unspecified: Secondary | ICD-10-CM

## 2022-06-19 DIAGNOSIS — M25473 Effusion, unspecified ankle: Secondary | ICD-10-CM | POA: Diagnosis not present

## 2022-06-19 DIAGNOSIS — Z1211 Encounter for screening for malignant neoplasm of colon: Secondary | ICD-10-CM | POA: Diagnosis not present

## 2022-06-19 MED ORDER — ROSUVASTATIN CALCIUM 20 MG PO TABS: 20.0000 mg | ORAL_TABLET | Freq: Every day | ORAL | 3 refills | Status: DC

## 2022-06-19 MED ORDER — CLOTRIMAZOLE-BETAMETHASONE 1-0.05 % EX CREA: 1.0000 | TOPICAL_CREAM | Freq: Two times a day (BID) | CUTANEOUS | 11 refills | Status: AC

## 2022-06-19 MED ORDER — ALLOPURINOL 300 MG PO TABS: 300.0000 mg | ORAL_TABLET | Freq: Every day | ORAL | 3 refills | Status: DC

## 2022-06-19 NOTE — Progress Notes (Signed)
  Subjective:    CC: Annual Physical Exam  HPI:  This patient is here for their annual physical  I reviewed the past medical history, family history, social history, surgical history, and allergies today and no changes were needed.  Please see the problem list section below in epic for further details.  Past Medical History: Past Medical History:  Diagnosis Date   Hyperlipidemia    Past Surgical History: Past Surgical History:  Procedure Laterality Date   ADENOIDECTOMY     Social History: Social History   Socioeconomic History   Marital status: Married    Spouse name: Not on file   Number of children: Not on file   Years of education: Not on file   Highest education level: Not on file  Occupational History   Not on file  Tobacco Use   Smoking status: Never   Smokeless tobacco: Never  Substance and Sexual Activity   Alcohol use: Yes   Drug use: Never   Sexual activity: Yes  Other Topics Concern   Not on file  Social History Narrative   Not on file   Social Determinants of Health   Financial Resource Strain: Not on file  Food Insecurity: Not on file  Transportation Needs: Not on file  Physical Activity: Not on file  Stress: Not on file  Social Connections: Not on file   Family History: Family History  Problem Relation Age of Onset   Hypertension Mother    Diabetes Father    Allergies: Allergies  Allergen Reactions   Penicillins    Medications: See med rec.  Review of Systems: No headache, visual changes, nausea, vomiting, diarrhea, constipation, dizziness, abdominal pain, skin rash, fevers, chills, night sweats, swollen lymph nodes, weight loss, chest pain, body aches, joint swelling, muscle aches, shortness of breath, mood changes, visual or auditory hallucinations.  Objective:    General: Well Developed, well nourished, and in no acute distress.  Neuro: Alert and oriented x3, extra-ocular muscles intact, sensation grossly intact. Cranial nerves II  through XII are intact, motor, sensory, and coordinative functions are all intact. HEENT: Normocephalic, atraumatic, pupils equal round reactive to light, neck supple, no masses, no lymphadenopathy, thyroid nonpalpable. Oropharynx, nasopharynx, external ear canals are unremarkable. Skin: Warm and dry, no rashes noted.  Cardiac: Regular rate and rhythm, no murmurs rubs or gallops.  Respiratory: Clear to auscultation bilaterally. Not using accessory muscles, speaking in full sentences.  Abdominal: Soft, nontender, nondistended, positive bowel sounds, no masses, no organomegaly.  Musculoskeletal: Shoulder, elbow, wrist, hip, knee, ankle stable, and with full range of motion.  Impression and Recommendations:    The patient was counselled, risk factors were discussed, anticipatory guidance given.  Annual physical exam Annual physical as above, ordering Cologuard, had his flu shot last month. Return to see me in a year.  ____________________________________________ Ihor Austin. Benjamin Stain, M.D., ABFM., CAQSM., AME. Primary Care and Sports Medicine [facility]  Adjunct Professor of Family Medicine  [place] [employer] [facility] [employer]  Restaurant manager, fast food

## 2022-06-19 NOTE — Assessment & Plan Note (Signed)
Annual physical as above, ordering Cologuard, had his flu shot last month. Return to see me in a year.

## 2022-06-20 LAB — LIPID PANEL
Cholesterol: 208 mg/dL — ABNORMAL HIGH (ref <200)
HDL: 66 mg/dL (ref >=40)
LDL Cholesterol (Calc): 113 mg/dL (calc) — ABNORMAL HIGH
Non-HDL Cholesterol (Calc): 142 mg/dL (calc) — ABNORMAL HIGH (ref <130)
Total CHOL/HDL Ratio: 3.2 (calc) (ref <5.0)
Triglycerides: 170 mg/dL — ABNORMAL HIGH (ref <150)

## 2022-06-20 LAB — COMPLETE METABOLIC PANEL WITH GFR
AG Ratio: 2 (calc) (ref 1.0–2.5)
ALT: 70 U/L — ABNORMAL HIGH (ref 9–46)
AST: 44 U/L — ABNORMAL HIGH (ref 10–40)
Albumin: 4.8 g/dL (ref 3.6–5.1)
Alkaline phosphatase (APISO): 55 U/L (ref 36–130)
BUN: 12 mg/dL (ref 7–25)
CO2: 29 mmol/L (ref 20–32)
Calcium: 9.8 mg/dL (ref 8.6–10.3)
Chloride: 103 mmol/L (ref 98–110)
Creat: 1.04 mg/dL (ref 0.60–1.29)
Globulin: 2.4 g/dL (calc) (ref 1.9–3.7)
Glucose, Bld: 96 mg/dL (ref 65–99)
Potassium: 5.2 mmol/L (ref 3.5–5.3)
Sodium: 139 mmol/L (ref 135–146)
Total Bilirubin: 0.8 mg/dL (ref 0.2–1.2)
Total Protein: 7.2 g/dL (ref 6.1–8.1)
eGFR: 90 mL/min/{1.73_m2} (ref >=60)

## 2022-06-20 LAB — CBC
HCT: 44.7 % (ref 38.5–50.0)
Hemoglobin: 15.4 g/dL (ref 13.2–17.1)
MCH: 31.4 pg (ref 27.0–33.0)
MCHC: 34.5 g/dL (ref 32.0–36.0)
MCV: 91 fL (ref 80.0–100.0)
MPV: 9.3 fL (ref 7.5–12.5)
Platelets: 189 10*3/uL (ref 140–400)
RBC: 4.91 10*6/uL (ref 4.20–5.80)
RDW: 13.1 % (ref 11.0–15.0)
WBC: 4.6 10*3/uL (ref 3.8–10.8)

## 2022-06-20 LAB — HEMOGLOBIN A1C
Hgb A1c MFr Bld: 5.3 % of total Hgb (ref <5.7)
Mean Plasma Glucose: 105 mg/dL
eAG (mmol/L): 5.8 mmol/L

## 2022-06-20 LAB — URIC ACID: Uric Acid, Serum: 3.5 mg/dL — ABNORMAL LOW (ref 4.0–8.0)

## 2022-06-20 LAB — TSH: TSH: 1.91 mIU/L (ref 0.40–4.50)

## 2022-07-23 LAB — COLOGUARD: COLOGUARD: NEGATIVE

## 2023-05-02 ENCOUNTER — Other Ambulatory Visit: Payer: Self-pay | Admitting: Sports Medicine

## 2023-05-02 DIAGNOSIS — E785 Hyperlipidemia, unspecified: Secondary | ICD-10-CM

## 2023-06-14 ENCOUNTER — Other Ambulatory Visit: Payer: Self-pay | Admitting: Sports Medicine

## 2023-06-14 DIAGNOSIS — M25473 Effusion, unspecified ankle: Secondary | ICD-10-CM

## 2023-06-23 ENCOUNTER — Encounter: Payer: 59 | Admitting: Sports Medicine

## 2023-06-27 ENCOUNTER — Ambulatory Visit (INDEPENDENT_AMBULATORY_CARE_PROVIDER_SITE_OTHER): Payer: 59 | Admitting: Sports Medicine

## 2023-06-27 VITALS — BP 118/72 | HR 77 | Ht 72.0 in | Wt 220.0 lb

## 2023-06-27 DIAGNOSIS — Z0000 Encounter for general adult medical examination without abnormal findings: Secondary | ICD-10-CM | POA: Diagnosis not present

## 2023-06-27 DIAGNOSIS — R7401 Elevation of levels of liver transaminase levels: Secondary | ICD-10-CM | POA: Diagnosis not present

## 2023-06-27 DIAGNOSIS — M25473 Effusion, unspecified ankle: Secondary | ICD-10-CM | POA: Diagnosis not present

## 2023-06-27 DIAGNOSIS — E785 Hyperlipidemia, unspecified: Secondary | ICD-10-CM

## 2023-06-27 NOTE — Progress Notes (Signed)
Subjective:    CC: Annual Physical Exam  HPI:  This patient is here for their annual physical  I reviewed the past medical history, family history, social history, surgical history, and allergies today and no changes were needed.  Please see the problem list section below in epic for further details.  Past Medical History: Past Medical History:  Diagnosis Date   Hyperlipidemia    Past Surgical History: Past Surgical History:  Procedure Laterality Date   ADENOIDECTOMY     Social History: Social History   Socioeconomic History   Marital status: Married    Spouse name: Not on file   Number of children: Not on file   Years of education: Not on file   Highest education level: Bachelor's degree (e.g., BA, AB, BS)  Occupational History   Not on file  Tobacco Use   Smoking status: Never   Smokeless tobacco: Never  Substance and Sexual Activity   Alcohol use: Yes   Drug use: Never   Sexual activity: Yes  Other Topics Concern   Not on file  Social History Narrative   Not on file   Social Drivers of Health   Financial Resource Strain: Low Risk  (06/27/2023)   Overall Financial Resource Strain (CARDIA)    Difficulty of Paying Living Expenses: Not hard at all  Food Insecurity: No Food Insecurity (06/27/2023)   Hunger Vital Sign    Worried About Running Out of Food in the Last Year: Never true    Ran Out of Food in the Last Year: Never true  Transportation Needs: No Transportation Needs (06/27/2023)   PRAPARE - Administrator, Civil Service (Medical): No    Lack of Transportation (Non-Medical): No  Physical Activity: Sufficiently Active (06/27/2023)   Exercise Vital Sign    Days of Exercise per Week: 4 days    Minutes of Exercise per Session: 60 min  Stress: No Stress Concern Present (06/27/2023)   [employer] of Occupational Health - Occupational Stress Questionnaire    Feeling of Stress : Only a little  Social Connections: Socially Integrated  (06/27/2023)   Social Connection and Isolation Panel [NHANES]    Frequency of Communication with Friends and Family: More than three times a week    Frequency of Social Gatherings with Friends and Family: Three times a week    Attends Religious Services: More than 4 times per year    Active Member of Clubs or Organizations: Yes    Attends Engineer, structural: More than 4 times per year    Marital Status: Married   Family History: Family History  Problem Relation Age of Onset   Hypertension Mother    Diabetes Father    Allergies: Allergies  Allergen Reactions   Penicillins    Medications: See med rec.  Review of Systems: No headache, visual changes, nausea, vomiting, diarrhea, constipation, dizziness, abdominal pain, skin rash, fevers, chills, night sweats, swollen lymph nodes, weight loss, chest pain, body aches, joint swelling, muscle aches, shortness of breath, mood changes, visual or auditory hallucinations.  Objective:    General: Well Developed, well nourished, and in no acute distress.  Neuro: Alert and oriented x3, extra-ocular muscles intact, sensation grossly intact. Cranial nerves II through XII are intact, motor, sensory, and coordinative functions are all intact. HEENT: Normocephalic, atraumatic, pupils equal round reactive to light, neck supple, no masses, no lymphadenopathy, thyroid nonpalpable. Oropharynx, nasopharynx, external ear canals are unremarkable. Skin: Warm and dry, no rashes noted.  Cardiac: Regular  rate and rhythm, no murmurs rubs or gallops.  Respiratory: Clear to auscultation bilaterally. Not using accessory muscles, speaking in full sentences.  Abdominal: Soft, nontender, nondistended, positive bowel sounds, no masses, no organomegaly.  Musculoskeletal: Shoulder, elbow, wrist, hip, knee, ankle stable, and with full range of motion.  Impression and Recommendations:    The patient was counselled, risk factors were discussed, anticipatory  guidance given.  Annual physical exam Annual physical exam as above, updated on screening measures. Return to see me in a year for fasting annual physical.  Transaminitis Very mild LFT elevation over the past several years, we will going get a liver ultrasound and recheck LFTs in about a month, if persistently elevated we will need to do additional serum workup.  Ankle swelling and pain with hyperuricemia Previously seen a year ago with what appeared to be a gout flare, uric acid levels were in the nines, allopurinol started and they dropped down to below 4. I think we can going ahead and decrease allopurinol to 100 mg and then recheck uric acid in a month.   ____________________________________________ Ihor Austin. Benjamin Stain, M.D., ABFM., CAQSM., AME. Primary Care and Sports Medicine [place] [facility]  Adjunct Professor of Family Medicine  [place] [employer] [facility] [employer]  Restaurant manager, fast food

## 2023-06-27 NOTE — Assessment & Plan Note (Signed)
Annual physical exam as above, updated on screening measures. Return to see me in a year for fasting annual physical.

## 2023-06-28 LAB — COMPREHENSIVE METABOLIC PANEL
ALT: 61 [IU]/L — ABNORMAL HIGH (ref 0–44)
AST: 36 [IU]/L (ref 0–40)
Albumin: 4.9 g/dL (ref 4.1–5.1)
Alkaline Phosphatase: 74 [IU]/L (ref 44–121)
BUN/Creatinine Ratio: 14 (ref 9–20)
BUN: 13 mg/dL (ref 6–24)
Bilirubin Total: 0.7 mg/dL (ref 0.0–1.2)
CO2: 25 mmol/L (ref 20–29)
Calcium: 10.2 mg/dL (ref 8.7–10.2)
Chloride: 99 mmol/L (ref 96–106)
Creatinine, Ser: 0.92 mg/dL (ref 0.76–1.27)
Globulin, Total: 2.5 g/dL (ref 1.5–4.5)
Glucose: 104 mg/dL — ABNORMAL HIGH (ref 70–99)
Potassium: 4.6 mmol/L (ref 3.5–5.2)
Sodium: 138 mmol/L (ref 134–144)
Total Protein: 7.4 g/dL (ref 6.0–8.5)
eGFR: 103 mL/min/{1.73_m2} (ref >59)

## 2023-06-28 LAB — LIPID PANEL
Chol/HDL Ratio: 3.3 {ratio} (ref 0.0–5.0)
Cholesterol, Total: 207 mg/dL — ABNORMAL HIGH (ref 100–199)
HDL: 62 mg/dL (ref >39)
LDL Chol Calc (NIH): 123 mg/dL — ABNORMAL HIGH (ref 0–99)
Triglycerides: 122 mg/dL (ref 0–149)
VLDL Cholesterol Cal: 22 mg/dL (ref 5–40)

## 2023-06-28 LAB — HEMOGLOBIN A1C
Est. average glucose Bld gHb Est-mCnc: 108 mg/dL
Hgb A1c MFr Bld: 5.4 % (ref 4.8–5.6)

## 2023-06-28 LAB — CBC
Hematocrit: 46.9 % (ref 37.5–51.0)
Hemoglobin: 16.2 g/dL (ref 13.0–17.7)
MCH: 31 pg (ref 26.6–33.0)
MCHC: 34.5 g/dL (ref 31.5–35.7)
MCV: 90 fL (ref 79–97)
Platelets: 240 10*3/uL (ref 150–450)
RBC: 5.22 x10E6/uL (ref 4.14–5.80)
RDW: 13.3 % (ref 11.6–15.4)
WBC: 10.7 10*3/uL (ref 3.4–10.8)

## 2023-06-28 LAB — URIC ACID: Uric Acid: 3.7 mg/dL — ABNORMAL LOW (ref 3.8–8.4)

## 2023-06-28 LAB — TSH: TSH: 1.33 u[IU]/mL (ref 0.450–4.500)

## 2023-06-29 DIAGNOSIS — R7401 Elevation of levels of liver transaminase levels: Secondary | ICD-10-CM | POA: Insufficient documentation

## 2023-06-29 DIAGNOSIS — R945 Abnormal results of liver function studies: Secondary | ICD-10-CM | POA: Insufficient documentation

## 2023-06-29 MED ORDER — ALLOPURINOL 100 MG PO TABS: 100.0000 mg | ORAL_TABLET | Freq: Every day | ORAL | 11 refills | Status: DC

## 2023-06-29 NOTE — Addendum Note (Signed)
Addended by: Monica Becton on: 06/29/2023 07:08 AM   Modules accepted: Orders

## 2023-06-29 NOTE — Assessment & Plan Note (Signed)
Previously seen a year ago with what appeared to be a gout flare, uric acid levels were in the nines, allopurinol started and they dropped down to below 4. I think we can going ahead and decrease allopurinol to 100 mg and then recheck uric acid in a month.

## 2023-06-29 NOTE — Assessment & Plan Note (Signed)
Very mild LFT elevation over the past several years, we will going get a liver ultrasound and recheck LFTs in about a month, if persistently elevated we will need to do additional serum workup.

## 2023-09-12 ENCOUNTER — Other Ambulatory Visit: Payer: Self-pay | Admitting: Sports Medicine

## 2023-09-12 DIAGNOSIS — M25473 Effusion, unspecified ankle: Secondary | ICD-10-CM

## 2023-12-20 ENCOUNTER — Other Ambulatory Visit: Payer: Self-pay | Admitting: Sports Medicine

## 2023-12-20 DIAGNOSIS — M25473 Effusion, unspecified ankle: Secondary | ICD-10-CM

## 2024-03-09 ENCOUNTER — Encounter: Payer: Self-pay | Admitting: Sports Medicine

## 2024-04-30 ENCOUNTER — Encounter: Admitting: Student in an Organized Health Care Education/Training Program

## 2024-04-30 ENCOUNTER — Encounter: Payer: Self-pay | Admitting: Student in an Organized Health Care Education/Training Program

## 2024-04-30 ENCOUNTER — Ambulatory Visit (INDEPENDENT_AMBULATORY_CARE_PROVIDER_SITE_OTHER): Admitting: Student in an Organized Health Care Education/Training Program

## 2024-04-30 VITALS — BP 137/97 | HR 56 | Ht 71.2 in | Wt 221.0 lb

## 2024-04-30 DIAGNOSIS — Z1159 Encounter for screening for other viral diseases: Secondary | ICD-10-CM

## 2024-04-30 DIAGNOSIS — E785 Hyperlipidemia, unspecified: Secondary | ICD-10-CM

## 2024-04-30 DIAGNOSIS — Z23 Encounter for immunization: Secondary | ICD-10-CM

## 2024-04-30 DIAGNOSIS — M109 Gout, unspecified: Secondary | ICD-10-CM | POA: Insufficient documentation

## 2024-04-30 DIAGNOSIS — M1A00X0 Idiopathic chronic gout, unspecified site, without tophus (tophi): Secondary | ICD-10-CM

## 2024-04-30 DIAGNOSIS — R945 Abnormal results of liver function studies: Secondary | ICD-10-CM | POA: Diagnosis not present

## 2024-04-30 DIAGNOSIS — Z0000 Encounter for general adult medical examination without abnormal findings: Secondary | ICD-10-CM | POA: Diagnosis not present

## 2024-04-30 DIAGNOSIS — I10 Essential (primary) hypertension: Secondary | ICD-10-CM

## 2024-04-30 DIAGNOSIS — E663 Overweight: Secondary | ICD-10-CM

## 2024-04-30 DIAGNOSIS — Z114 Encounter for screening for human immunodeficiency virus [HIV]: Secondary | ICD-10-CM

## 2024-04-30 LAB — COMPREHENSIVE METABOLIC PANEL WITH GFR
ALT: 45 U/L (ref 0–53)
AST: 32 U/L (ref 0–37)
Albumin: 4.9 g/dL (ref 3.5–5.2)
Alkaline Phosphatase: 55 U/L (ref 39–117)
BUN: 17 mg/dL (ref 6–23)
CO2: 28 meq/L (ref 19–32)
Calcium: 9.7 mg/dL (ref 8.4–10.5)
Chloride: 101 meq/L (ref 96–112)
Creatinine, Ser: 1.03 mg/dL (ref 0.40–1.50)
GFR: 85.82 mL/min (ref >60.00)
Glucose, Bld: 97 mg/dL (ref 70–99)
Potassium: 4.5 meq/L (ref 3.5–5.1)
Sodium: 139 meq/L (ref 135–145)
Total Bilirubin: 0.8 mg/dL (ref 0.2–1.2)
Total Protein: 7.3 g/dL (ref 6.0–8.3)

## 2024-04-30 LAB — LIPID PANEL
Cholesterol: 186 mg/dL (ref 0–200)
HDL: 50.8 mg/dL (ref >39.00)
LDL Cholesterol: 93 mg/dL (ref 0–99)
NonHDL: 135.36
Total CHOL/HDL Ratio: 4
Triglycerides: 210 mg/dL — ABNORMAL HIGH (ref 0.0–149.0)
VLDL: 42 mg/dL — ABNORMAL HIGH (ref 0.0–40.0)

## 2024-04-30 LAB — IBC + FERRITIN
Ferritin: 150.5 ng/mL (ref 22.0–322.0)
Iron: 111 ug/dL (ref 42–165)
Saturation Ratios: 25.5 % (ref 20.0–50.0)
TIBC: 435.4 ug/dL (ref 250.0–450.0)
Transferrin: 311 mg/dL (ref 212.0–360.0)

## 2024-04-30 LAB — CBC
HCT: 44.8 % (ref 39.0–52.0)
Hemoglobin: 15.2 g/dL (ref 13.0–17.0)
MCHC: 33.8 g/dL (ref 30.0–36.0)
MCV: 90.1 fl (ref 78.0–100.0)
Platelets: 220 K/uL (ref 150.0–400.0)
RBC: 4.98 Mil/uL (ref 4.22–5.81)
RDW: 13.2 % (ref 11.5–15.5)
WBC: 5.3 K/uL (ref 4.0–10.5)

## 2024-04-30 LAB — MICROALBUMIN / CREATININE URINE RATIO
Creatinine,U: 59.4 mg/dL
Microalb Creat Ratio: UNDETERMINED mg/g (ref 0.0–30.0)
Microalb, Ur: <0.7 mg/dL

## 2024-04-30 NOTE — Assessment & Plan Note (Signed)
 He has previously elevated liver enzymes with no identified etiology.  No significant alcohol use.  ALT elevated above the AST in the past.  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  Will check liver enzymes today along with platelets, viral hepatitis serologies, and iron levels to rule out hemochromatosis.  Will check fib 4 and may need elastography if there is risk of fibrosis.

## 2024-04-30 NOTE — Progress Notes (Signed)
 Complete physical exam  Patient: Barry Burns    DOB: 27-Dec-1975 48 y.o.   MRN: [id number]  Chief Complaint  Patient presents with   Establish Care    No concerns  Patient does have a prescription that is running out  Flu-No Colonoscopy- Not completed before but did a cologuard about 1-2 year ago.     Subjective:    Barry Burns is a 48 y.o. male who presents today for a complete physical exam. He reports consuming a general diet.  He generally feels well. He reports sleeping well. He does not have additional problems to discuss today.   Discussed the use of AI scribe software for clinical note transcription with the patient, who gave verbal consent to proceed.  History of Present Illness Barry Burns is a 48 year old male who presents for reestablishing care with a new doctor.  He has been on rosuvastatin  20 mg once daily for 3-4 years due to lifelong hypercholesterolemia. Despite dietary changes, his LDL levels have remained elevated, typically in the low to mid 200s. He has no history of myocardial infarction or cerebrovascular accidents. There is no immediate family history of these conditions, although his maternal grandfather had myocardial infarctions. His wife recently had elevated cholesterol levels as well.  He has a history of gout with flare-ups occurring 5-6 times in his life, primarily affecting his big toe. He has been on allopurinol  for 5-6 years, initially at 300 mg and now at 100 mg daily, with no recent flare-ups. His uric acid levels have been well-controlled, with recent levels below 5 mg/dL.  He is aware that past blood work showed elevated liver enzymes, but he has not been informed of any specific concerns. He weighs 221 pounds with a BMI of 30, and he has gained muscle mass while losing inches around his waist due to increased strength training 3-4 times a week.  He has a history of knee soreness and Osgood-Schlatter  disease from his teenage years, but no significant joint swelling or arthritis. He experiences occasional elbow discomfort during curls but no issues during squats.  He does not use tobacco or drugs and consumes alcohol moderately, typically 1-2 glasses of wine on weekends. He occasionally uses protein shakes but no other supplements.  He has had ear tubes in the past and has some scarring on his eardrums but reports no current hearing issues. He uses reading glasses for close-up vision.   Most recent fall risk assessment:    04/30/2024   10:32 AM  Fall Risk   Falls in the past year? 0  Number falls in past yr: 0  Injury with Fall? 0  Risk for fall due to : No Fall Risks  Follow up Falls evaluation completed     Most recent depression screenings:    04/30/2024   10:32 AM 06/27/2023    3:10 PM  PHQ 2/9 Scores  PHQ - 2 Score 0 0  PHQ- 9 Score 0     Patient Care Team: Jerrell Cleatus Ned, MD as PCP - General (Internal Medicine)   ROS    Objective:    BP (!) 137/97   Pulse (!) 56   Ht 5' 11.2 (1.808 m)   Wt 221 lb (100.2 kg)   SpO2 100%   BMI 30.65 kg/m   Physical Exam   Gen: Well-appearing man Eyes: Normal Ears: Normal hearing, bilateral tympanic membranes are scarred from prior tube placement, no perforations, no effusions, no light  reflex, no normal landmarks Neck: Normal thyroid, no adenopathy Heart: Regular, no murmur Lungs: Unlabored, clear throughout Abd: Soft, nontender, no hernias, no organomegaly Ext: Warm, no edema MSK: Normal joints Skin: No rash, no atypical pigmented lesions, moderately sun exposed trunk and extremities Neuro: Alert, conversational, full strength upper and lower extremities, normal gait and balance Psych: Appropriate mood and affect, not anxious or depressed appearing     Assessment & Plan:    Routine Health Maintenance and Physical Exam Immunization History  Administered Date(s) Administered   Influenza, Seasonal,  Injecte, Preservative Fre 04/30/2024   Influenza-Unspecified 05/22/2022, 05/01/2023   Tdap 06/18/2021    Health Maintenance  Topic Date Due   Hepatitis C Screening  Never done   Hepatitis B Vaccines 19-59 Average Risk (1 of 3 - 19+ 3-dose series) Never done   Colonoscopy  Never done   DTaP/Tdap/Td (2 - Td or Tdap) 06/19/2031   Influenza Vaccine  Completed   Pneumococcal Vaccine  Aged Out   HPV VACCINES  Aged Out   Meningococcal B Vaccine  Aged Out   COVID-19 Vaccine  Discontinued   HIV Screening  Discontinued    Discussed health benefits of physical activity, and encouraged him to engage in regular exercise appropriate for his age and condition.  Problem List Items Addressed This Visit       High   Abnormal liver function (Chronic)   He has previously elevated liver enzymes with no identified etiology.  No significant alcohol use.  ALT elevated above the AST in the past.  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  Will check liver enzymes today along with platelets, viral hepatitis serologies, and iron levels to rule out hemochromatosis.  Will check fib 4 and may need elastography if there is risk of fibrosis.      Relevant Orders   CBC   Comprehensive metabolic panel with GFR   Hepatitis C antibody   IBC + Ferritin   Hepatitis B surface antibody,qualitative   Hepatitis B surface antigen   Hepatitis B Core Antibody, total   Overweight (BMI 25.0-29.9) (Chronic)   Weight today is 221 pounds with a BMI of 30.  I think BMI likely overestimates his risk given he is pretty heavily muscled.  We talked about lifestyle modifications including diet changes.  Recommended calorie counting especially if he is looking to lose fat, increase lean.        Medium    Hyperlipidemia (Chronic)   Chronic hyperlipidemia with LDL levels in the low to mid 200s, likely genetic, is managed with rosuvastatin . He has no history of cardiovascular events but has a family history of heart  attacks. Continue rosuvastatin  20 mg daily and order a lipid panel.      Relevant Orders   Lipid panel   Gout (Chronic)   Gout is well-controlled on allopurinol  100 mg daily, with uric acid levels below 5 mg/dL. Continue allopurinol  100 mg daily and order a uric acid level.  Will check CBC, renal function, and uric acid today.      Hypertension (Chronic)   Blood pressure mildly elevated today, consistent with stage I hypertension.  Young age.  Will check urine microalbumin today, lipids, assess for risk of CVD.  Recommended lifestyle modifications currently.  Would like to recheck the blood pressure in 1 year.      Relevant Orders   Microalbumin / creatinine urine ratio     Low   Health maintenance examination - Primary (Chronic)   He  is in good health, exercises regularly, maintains a balanced diet, and does not use tobacco or drugs. Alcohol consumption is safe, and a recent Cologuard test was negative in 2024. Administer the flu vaccine, order annual blood work including kidney function and cholesterol, and schedule an annual wellness visit. Refill rosuvastatin  and allopurinol  prescriptions.  No issues with substance use or mood.      Other Visit Diagnoses       Needs flu shot       Relevant Orders   Flu vaccine trivalent PF, 6mos and older(Flulaval,Afluria,Fluarix,Fluzone) (Completed)     Encounter for HCV screening test for low risk patient       Relevant Orders   Hepatitis C antibody     Screening for HIV (human immunodeficiency virus)       Relevant Orders   HIV Antibody (routine testing w rflx)       Return in about 1 year (around 04/30/2025).    Cleatus Debby Specking, MD [facility]

## 2024-04-30 NOTE — Assessment & Plan Note (Addendum)
 Gout is well-controlled on allopurinol  100 mg daily, with uric acid levels below 5 mg/dL. Continue allopurinol  100 mg daily and order a uric acid level.  Will check CBC, renal function, and uric acid today.

## 2024-04-30 NOTE — Assessment & Plan Note (Signed)
 Blood pressure mildly elevated today, consistent with stage I hypertension.  Young age.  Will check urine microalbumin today, lipids, assess for risk of CVD.  Recommended lifestyle modifications currently.  Would like to recheck the blood pressure in 1 year.

## 2024-04-30 NOTE — Patient Instructions (Signed)
  VISIT SUMMARY: Today, you visited to reestablish care and discuss your ongoing health concerns. We reviewed your history of high cholesterol, gout, and elevated liver enzymes, and discussed your current medications and lifestyle habits.  YOUR PLAN: -HYPERLIPIDEMIA: Hyperlipidemia means having high levels of fats (lipids) in your blood, which can increase the risk of heart disease. You should continue taking rosuvastatin  20 mg daily, and we will check your cholesterol levels with a lipid panel.  -ELEVATED LIVER ENZYMES: Elevated liver enzymes can indicate liver inflammation or damage. We will perform liver function tests and screen for viral hepatitis to determine the cause.  -GOUT: Gout is a type of arthritis caused by excess uric acid in the blood, leading to joint pain. Your gout is well-controlled with allopurinol  100 mg daily, and we will check your uric acid levels to ensure they remain low.  -GENERAL HEALTH MAINTENANCE: You are in good health overall, with regular exercise and a balanced diet. We will administer the flu vaccine, order your annual blood work including kidney function and cholesterol, and schedule your annual wellness visit. Your prescriptions for rosuvastatin  and allopurinol  will be refilled.  INSTRUCTIONS: Please follow up for your annual wellness visit and the blood tests we discussed. Continue taking your medications as prescribed, and maintain your healthy lifestyle habits.

## 2024-04-30 NOTE — Telephone Encounter (Signed)
 Patient states his wife has been asking him to have a calcium  score completed and he is wondering if you would order this for him?

## 2024-04-30 NOTE — Assessment & Plan Note (Signed)
 Weight today is 221 pounds with a BMI of 30.  I think BMI likely overestimates his risk given he is pretty heavily muscled.  We talked about lifestyle modifications including diet changes.  Recommended calorie counting especially if he is looking to lose fat, increase lean.

## 2024-04-30 NOTE — Assessment & Plan Note (Signed)
 Chronic hyperlipidemia with LDL levels in the low to mid 200s, likely genetic, is managed with rosuvastatin . He has no history of cardiovascular events but has a family history of heart attacks. Continue rosuvastatin  20 mg daily and order a lipid panel.

## 2024-04-30 NOTE — Assessment & Plan Note (Signed)
 He is in good health, exercises regularly, maintains a balanced diet, and does not use tobacco or drugs. Alcohol consumption is safe, and a recent Cologuard test was negative in 2024. Administer the flu vaccine, order annual blood work including kidney function and cholesterol, and schedule an annual wellness visit. Refill rosuvastatin  and allopurinol  prescriptions.  No issues with substance use or mood.

## 2024-04-30 NOTE — Telephone Encounter (Signed)
Patient responded.

## 2024-05-01 LAB — HEPATITIS C ANTIBODY: Hepatitis C Ab: NONREACTIVE

## 2024-05-01 LAB — HEPATITIS B SURFACE ANTIGEN: Hepatitis B Surface Ag: NONREACTIVE

## 2024-05-01 LAB — HEPATITIS B CORE ANTIBODY, TOTAL: Hep B Core Total Ab: NONREACTIVE

## 2024-05-01 LAB — HIV ANTIBODY (ROUTINE TESTING W REFLEX)
HIV 1&2 Ab, 4th Generation: NONREACTIVE
HIV FINAL INTERPRETATION: NEGATIVE

## 2024-05-01 LAB — HEPATITIS B SURFACE ANTIBODY,QUALITATIVE: Hep B S Ab: REACTIVE — AB

## 2024-05-03 ENCOUNTER — Ambulatory Visit: Payer: Self-pay | Admitting: Student in an Organized Health Care Education/Training Program

## 2024-05-03 ENCOUNTER — Other Ambulatory Visit: Payer: Self-pay | Admitting: Student in an Organized Health Care Education/Training Program

## 2024-05-03 DIAGNOSIS — E785 Hyperlipidemia, unspecified: Secondary | ICD-10-CM

## 2024-05-03 MED ORDER — ROSUVASTATIN CALCIUM 20 MG PO TABS: 20.0000 mg | ORAL_TABLET | Freq: Every day | ORAL | 3 refills | Status: AC

## 2024-05-24 ENCOUNTER — Ambulatory Visit (HOSPITAL_BASED_OUTPATIENT_CLINIC_OR_DEPARTMENT_OTHER)
Admission: RE | Admit: 2024-05-24 | Discharge: 2024-05-24 | Disposition: A | Discharge status: Home / Self Care | Payer: Self-pay | Source: Ambulatory Visit | Attending: Student in an Organized Health Care Education/Training Program | Admitting: Student in an Organized Health Care Education/Training Program

## 2024-05-24 DIAGNOSIS — E785 Hyperlipidemia, unspecified: Secondary | ICD-10-CM | POA: Insufficient documentation

## 2024-05-25 ENCOUNTER — Ambulatory Visit: Payer: Self-pay | Admitting: Student in an Organized Health Care Education/Training Program

## 2024-05-25 NOTE — Telephone Encounter (Signed)
 FYI  Response to your mychart

## 2024-06-25 ENCOUNTER — Encounter: Payer: 59 | Admitting: Sports Medicine

## 2024-07-05 ENCOUNTER — Other Ambulatory Visit: Payer: Self-pay | Admitting: Student in an Organized Health Care Education/Training Program

## 2024-07-05 DIAGNOSIS — M25473 Effusion, unspecified ankle: Secondary | ICD-10-CM

## 2024-07-05 MED ORDER — ALLOPURINOL 100 MG PO TABS: 100.0000 mg | ORAL_TABLET | Freq: Every day | ORAL | 3 refills | Status: AC

## 2025-05-04 ENCOUNTER — Encounter: Admitting: Student in an Organized Health Care Education/Training Program
